# Patient Record
Sex: Male | Born: 1970 | Race: White | Hispanic: No | Marital: Married | Smoking: Never smoker
Health system: Southern US, Community
[De-identification: ages and names within clinical notes are randomized; demographics above are authoritative.]

## PROBLEM LIST (undated history)

## (undated) DIAGNOSIS — M2540 Effusion, unspecified joint: Secondary | ICD-10-CM

## (undated) DIAGNOSIS — E785 Hyperlipidemia, unspecified: Secondary | ICD-10-CM

## (undated) DIAGNOSIS — R202 Paresthesia of skin: Secondary | ICD-10-CM

## (undated) DIAGNOSIS — I10 Essential (primary) hypertension: Secondary | ICD-10-CM

## (undated) DIAGNOSIS — M2550 Pain in unspecified joint: Secondary | ICD-10-CM

## (undated) DIAGNOSIS — K219 Gastro-esophageal reflux disease without esophagitis: Secondary | ICD-10-CM

## (undated) DIAGNOSIS — Z8669 Personal history of other diseases of the nervous system and sense organs: Secondary | ICD-10-CM

## (undated) DIAGNOSIS — R200 Anesthesia of skin: Secondary | ICD-10-CM

## (undated) HISTORY — PX: RHINOPLASTY: SUR1284

---

## 2000-05-05 ENCOUNTER — Emergency Department (HOSPITAL_COMMUNITY): Admission: EM | Admit: 2000-05-05 | Discharge: 2000-05-05 | Payer: Self-pay | Admitting: Emergency Medicine

## 2014-01-18 ENCOUNTER — Encounter (HOSPITAL_COMMUNITY): Payer: Self-pay | Admitting: Family Medicine

## 2014-01-18 ENCOUNTER — Emergency Department (HOSPITAL_COMMUNITY)
Admission: EM | Admit: 2014-01-18 | Discharge: 2014-01-18 | Disposition: A | Discharge status: Home / Self Care | Payer: 59 | Source: Home / Self Care

## 2014-01-18 ENCOUNTER — Emergency Department (INDEPENDENT_AMBULATORY_CARE_PROVIDER_SITE_OTHER): Payer: 59

## 2014-01-18 DIAGNOSIS — I10 Essential (primary) hypertension: Secondary | ICD-10-CM

## 2014-01-18 DIAGNOSIS — Y9389 Activity, other specified: Secondary | ICD-10-CM

## 2014-01-18 DIAGNOSIS — S93401A Sprain of unspecified ligament of right ankle, initial encounter: Secondary | ICD-10-CM

## 2014-01-18 DIAGNOSIS — X500XXA Overexertion from strenuous movement or load, initial encounter: Secondary | ICD-10-CM

## 2014-01-18 DIAGNOSIS — S93409A Sprain of unspecified ligament of unspecified ankle, initial encounter: Secondary | ICD-10-CM

## 2014-01-18 HISTORY — DX: Essential (primary) hypertension: I10

## 2014-01-18 MED ORDER — METOPROLOL SUCCINATE ER 50 MG PO TB24: 50.0000 mg | ORAL_TABLET | Freq: Every day | ORAL | Status: AC

## 2014-01-18 NOTE — ED Notes (Signed)
Pt c/o right ankle inj x4 days Reports he twisted ankle going up the stairs.  Dr. Konrad DoloresMerrell is in the room w/the pt Alert w/no signs of acute distress.

## 2014-01-18 NOTE — ED Provider Notes (Signed)
CSN: [id number]     Arrival date & time 01/18/14  0827 History   None    Chief Complaint  Patient presents with  . Ankle Injury   (Consider location/radiation/quality/duration/timing/severity/associated sxs/prior Treatment) HPI  R ankle: 3 days ago going upstairs w/ child and ankle " gave out" . Unsure of exactly how his ankle was hurt but thinks it rolled in. Immediately painful and swollen. Hurts worse w/ ambulation and w/ plantar flexion. Ibuprofen 800 w/ some benefit.   HTN: take metoprolol but ran out in June. Denies CP, HA, SOB, Palpitations.    Past Medical History  Diagnosis Date  . Hypertension    Past Surgical History  Procedure Laterality Date  . Rhinoplasty     Family History  Problem Relation Age of Onset  . Hypertension Mother   . Hypertension Father    History  Substance Use Topics  . Smoking status: Never Smoker   . Smokeless tobacco: Not on file  . Alcohol Use: Yes    Review of Systems Per HPI with all other pertinent systems negative.   Allergies  Review of patient's allergies indicates no known allergies.  Home Medications   Prior to Admission medications   Medication Sig Start Date End Date Taking? Authorizing Provider  metoprolol succinate (TOPROL-XL) 50 MG 24 hr tablet Take 1 tablet (50 mg total) by mouth daily. Take with or immediately following a meal. 01/18/14   Ozella Rocks, MD   BP 153/109  Pulse 95  Temp(Src) 98.8 F (37.1 C) (Oral)  Resp 18  SpO2 98% Physical Exam  Constitutional: He appears well-developed and well-nourished.  HENT:  Head: Normocephalic and atraumatic.  Eyes: EOM are normal. Pupils are equal, round, and reactive to light.  Neck: Normal range of motion.  Cardiovascular: Normal rate, normal heart sounds and intact distal pulses.   No murmur heard. Pulmonary/Chest: Effort normal and breath sounds normal. No respiratory distress.  Abdominal: Soft. He exhibits no distension.  Musculoskeletal:  R ankle w/  decreased ROM secondary to pain. ttp along the inferior aspect of the lateral malleolus w/ surounding soft tissue swelling. Pain w/ plantar flexion and dorsiflexion agains resistance as well as internal and external rotation. Pulses and sensation intact. No ankle instability w/ antierior or posterior displacement.   Neurological: He is alert. He exhibits normal muscle tone.  Skin: Skin is warm. No rash noted.  Psychiatric: He has a normal mood and affect. His behavior is normal. Judgment and thought content normal.    ED Course  Procedures (including critical care time) Labs Review Labs Reviewed - No data to display  Imaging Review Dg Ankle Complete Right  01/18/2014   CLINICAL DATA:  Right ankle injury.  Ankle pain.  EXAM: RIGHT ANKLE - COMPLETE 3+ VIEW  COMPARISON:  None.  FINDINGS: Three views of the ankle demonstrate lateral soft tissue swelling. The ankle is located without a fracture. Alignment of the right ankle is normal.  IMPRESSION: Lateral soft tissue swelling without acute bone abnormality.   Electronically Signed   By: Richarda Overlie M.D.   On: 01/18/2014 09:33     MDM   1. Ankle sprain, right, initial encounter   2. Essential hypertension    Grade I ankle sprain: brace provided in clinic. Xray negative for fracture. NSAIDs, rest, ice. Early ambulation and no crutches necessary. Rehab exercises given  HTN: out of medications. Refilled metoprolol for 30 days  Precautions given and all questions answered  Shelly Flatten, MD Family Medicine 01/18/2014,  10:15 AM      Ozella Rocksavid J Eternity Dexter, MD 01/18/14 [id number]

## 2014-01-18 NOTE — Discharge Instructions (Signed)
You sprained your ankle. This will take several weeks to heal.  Wear your brace as needed and start your exercises to strengthen you ankle in the next 1-2 weeks.  Please srestart your blood pressure medications.    Acute Ankle Sprain with Phase I Rehab An acute ankle sprain is a partial or complete tear in one or more of the ligaments of the ankle due to traumatic injury. The severity of the injury depends on both the the number of ligaments sprained and the grade of sprain. There are 3 grades of sprains.   A grade 1 sprain is a mild sprain. There is a slight pull without obvious tearing. There is no loss of strength, and the muscle and ligament are the correct length.  A grade 2 sprain is a moderate sprain. There is tearing of fibers within the substance of the ligament where it connects two bones or two cartilages. The length of the ligament is increased, and there is usually decreased strength.  A grade 3 sprain is a complete rupture of the ligament and is uncommon. In addition to the grade of sprain, there are three types of ankle sprains.  Lateral ankle sprains: This is a sprain of one or more of the three ligaments on the outer side (lateral) of the ankle. These are the most common sprains. Medial ankle sprains: There is one large triangular ligament of the inner side (medial) of the ankle that is susceptible to injury. Medial ankle sprains are less common. Syndesmosis, "high ankle," sprains: The syndesmosis is the ligament that connects the two bones of the lower leg. Syndesmosis sprains usually only occur with very severe ankle sprains. SYMPTOMS  Pain, tenderness, and swelling in the ankle, starting at the side of injury that may progress to the whole ankle and foot with time.  "Pop" or tearing sensation at the time of injury.  Bruising that may spread to the heel.  Impaired ability to walk soon after injury. CAUSES   Acute ankle sprains are caused by trauma placed on the ankle  that temporarily forces or pries the anklebone (talus) out of its normal socket.  Stretching or tearing of the ligaments that normally hold the joint in place (usually due to a twisting injury). RISK INCREASES WITH:  Previous ankle sprain.  Sports in which the foot may land awkwardly (ie. basketball, volleyball, or soccer) or walking or running on uneven or rough surfaces.  Shoes with inadequate support to prevent sideways motion when stress occurs.  Poor strength and flexibility.  Poor balance skills.  Contact sports. PREVENTION   Warm up and stretch properly before activity.  Maintain physical fitness:  Ankle and leg flexibility, muscle strength, and endurance.  Cardiovascular fitness.  Balance training activities.  Use proper technique and have a coach correct improper technique.  Taping, protective strapping, bracing, or high-top tennis shoes may help prevent injury. Initially, tape is best; however, it loses most of its support function within 10 to 15 minutes.  Wear proper fitted protective shoes (High-top shoes with taping or bracing is more effective than either alone).  Provide the ankle with support during sports and practice activities for 12 months following injury. PROGNOSIS   If treated properly, ankle sprains can be expected to recover completely; however, the length of recovery depends on the degree of injury.  A grade 1 sprain usually heals enough in 5 to 7 days to allow modified activity and requires an average of 6 weeks to heal completely.  A grade 2  sprain requires 6 to 10 weeks to heal completely.  A grade 3 sprain requires 12 to 16 weeks to heal.  A syndesmosis sprain often takes more than 3 months to heal. RELATED COMPLICATIONS   Frequent recurrence of symptoms may result in a chronic problem. Appropriately addressing the problem the first time decreases the frequency of recurrence and optimizes healing time. Severity of the initial sprain does  not predict the likelihood of later instability.  Injury to other structures (bone, cartilage, or tendon).  A chronically unstable or arthritic ankle joint is a possiblity with repeated sprains. TREATMENT Treatment initially involves the use of ice, medication, and compression bandages to help reduce pain and inflammation. Ankle sprains are usually immobilized in a walking cast or boot to allow for healing. Crutches may be recommended to reduce pressure on the injury. After immobilization, strengthening and stretching exercises may be necessary to regain strength and a full range of motion. Surgery is rarely needed to treat ankle sprains. MEDICATION   Nonsteroidal anti-inflammatory medications, such as aspirin and ibuprofen (do not take for the first 3 days after injury or within 7 days before surgery), or other minor pain relievers, such as acetaminophen, are often recommended. Take these as directed by your caregiver. Contact your caregiver immediately if any bleeding, stomach upset, or signs of an allergic reaction occur from these medications.  Ointments applied to the skin may be helpful.  Pain relievers may be prescribed as necessary by your caregiver. Do not take prescription pain medication for longer than 4 to 7 days. Use only as directed and only as much as you need. HEAT AND COLD  Cold treatment (icing) is used to relieve pain and reduce inflammation for acute and chronic cases. Cold should be applied for 10 to 15 minutes every 2 to 3 hours for inflammation and pain and immediately after any activity that aggravates your symptoms. Use ice packs or an ice massage.  Heat treatment may be used before performing stretching and strengthening activities prescribed by your caregiver. Use a heat pack or a warm soak. SEEK IMMEDIATE MEDICAL CARE IF:   Pain, swelling, or bruising worsens despite treatment.  You experience pain, numbness, discoloration, or coldness in the foot or toes.  New,  unexplained symptoms develop (drugs used in treatment may produce side effects.) EXERCISES  PHASE I EXERCISES RANGE OF MOTION (ROM) AND STRETCHING EXERCISES - Ankle Sprain, Acute Phase I, Weeks 1 to 2 These exercises may help you when beginning to restore flexibility in your ankle. You will likely work on these exercises for the 1 to 2 weeks after your injury. Once your physician, physical therapist, or athletic trainer sees adequate progress, he or she will advance your exercises. While completing these exercises, remember:   Restoring tissue flexibility helps normal motion to return to the joints. This allows healthier, less painful movement and activity.  An effective stretch should be held for at least 30 seconds.  A stretch should never be painful. You should only feel a gentle lengthening or release in the stretched tissue. RANGE OF MOTION - Dorsi/Plantar Flexion  While sitting with your right / left knee straight, draw the top of your foot upwards by flexing your ankle. Then reverse the motion, pointing your toes downward.  Hold each position for __________ seconds.  After completing your first set of exercises, repeat this exercise with your knee bent. Repeat __________ times. Complete this exercise __________ times per day.  RANGE OF MOTION - Ankle Alphabet  Imagine your  right / left big toe is a pen.  Keeping your hip and knee still, write out the entire alphabet with your "pen." Make the letters as large as you can without increasing any discomfort. Repeat __________ times. Complete this exercise __________ times per day.  STRENGTHENING EXERCISES - Ankle Sprain, Acute -Phase I, Weeks 1 to 2 These exercises may help you when beginning to restore strength in your ankle. You will likely work on these exercises for 1 to 2 weeks after your injury. Once your physician, physical therapist, or athletic trainer sees adequate progress, he or she will advance your exercises. While  completing these exercises, remember:   Muscles can gain both the endurance and the strength needed for everyday activities through controlled exercises.  Complete these exercises as instructed by your physician, physical therapist, or athletic trainer. Progress the resistance and repetitions only as guided.  You may experience muscle soreness or fatigue, but the pain or discomfort you are trying to eliminate should never worsen during these exercises. If this pain does worsen, stop and make certain you are following the directions exactly. If the pain is still present after adjustments, discontinue the exercise until you can discuss the trouble with your clinician. STRENGTH - Dorsiflexors  Secure a rubber exercise band/tubing to a fixed object (ie. table, pole) and loop the other end around your right / left foot.  Sit on the floor facing the fixed object. The band/tubing should be slightly tense when your foot is relaxed.  Slowly draw your foot back toward you using your ankle and toes.  Hold this position for __________ seconds. Slowly release the tension in the band and return your foot to the starting position. Repeat __________ times. Complete this exercise __________ times per day.  STRENGTH - Plantar-flexors   Sit with your right / left leg extended. Holding onto both ends of a rubber exercise band/tubing, loop it around the ball of your foot. Keep a slight tension in the band.  Slowly push your toes away from you, pointing them downward.  Hold this position for __________ seconds. Return slowly, controlling the tension in the band/tubing. Repeat __________ times. Complete this exercise __________ times per day.  STRENGTH - Ankle Eversion  Secure one end of a rubber exercise band/tubing to a fixed object (table, pole). Loop the other end around your foot just before your toes.  Place your fists between your knees. This will focus your strengthening at your ankle.  Drawing the  band/tubing across your opposite foot, slowly, pull your little toe out and up. Make sure the band/tubing is positioned to resist the entire motion.  Hold this position for __________ seconds. Have your muscles resist the band/tubing as it slowly pulls your foot back to the starting position.  Repeat __________ times. Complete this exercise __________ times per day.  STRENGTH - Ankle Inversion  Secure one end of a rubber exercise band/tubing to a fixed object (table, pole). Loop the other end around your foot just before your toes.  Place your fists between your knees. This will focus your strengthening at your ankle.  Slowly, pull your big toe up and in, making sure the band/tubing is positioned to resist the entire motion.  Hold this position for __________ seconds.  Have your muscles resist the band/tubing as it slowly pulls your foot back to the starting position. Repeat __________ times. Complete this exercises __________ times per day.  STRENGTH - Towel Curls  Sit in a chair positioned on a non-carpeted surface.  Place your right / left foot on a towel, keeping your heel on the floor.  Pull the towel toward your heel by only curling your toes. Keep your heel on the floor.  If instructed by your physician, physical therapist, or athletic trainer, add weight to the end of the towel. Repeat __________ times. Complete this exercise __________ times per day. Document Released: 01/21/2005 Document Revised: 09/14/2011 Document Reviewed: 10/04/2008 [facility] Patient Information 2015 [employer] [place]. This information is not intended to replace advice given to you by your health care provider. Make sure you discuss any questions you have with your health care provider.

## 2014-02-22 ENCOUNTER — Other Ambulatory Visit (HOSPITAL_COMMUNITY): Payer: Self-pay | Admitting: Orthopedic Surgery

## 2014-02-22 DIAGNOSIS — M25511 Pain in right shoulder: Secondary | ICD-10-CM

## 2014-03-09 ENCOUNTER — Ambulatory Visit (HOSPITAL_COMMUNITY)
Admission: RE | Admit: 2014-03-09 | Discharge: 2014-03-09 | Disposition: A | Discharge status: Home / Self Care | Payer: 59 | Source: Ambulatory Visit | Attending: Orthopedic Surgery | Admitting: Orthopedic Surgery

## 2014-03-09 DIAGNOSIS — M25511 Pain in right shoulder: Secondary | ICD-10-CM

## 2014-03-26 ENCOUNTER — Encounter (INDEPENDENT_AMBULATORY_CARE_PROVIDER_SITE_OTHER): Payer: Self-pay

## 2014-03-26 ENCOUNTER — Ambulatory Visit (HOSPITAL_COMMUNITY)
Admission: RE | Admit: 2014-03-26 | Discharge: 2014-03-26 | Disposition: A | Discharge status: Home / Self Care | Payer: 59 | Source: Ambulatory Visit | Attending: Orthopedic Surgery | Admitting: Orthopedic Surgery

## 2014-03-26 DIAGNOSIS — R209 Unspecified disturbances of skin sensation: Secondary | ICD-10-CM | POA: Insufficient documentation

## 2014-03-26 DIAGNOSIS — R29898 Other symptoms and signs involving the musculoskeletal system: Secondary | ICD-10-CM | POA: Diagnosis not present

## 2014-03-26 DIAGNOSIS — M25519 Pain in unspecified shoulder: Secondary | ICD-10-CM | POA: Diagnosis present

## 2014-04-09 ENCOUNTER — Encounter (HOSPITAL_COMMUNITY): Payer: Self-pay | Admitting: Pharmacy Technician

## 2014-04-11 NOTE — Pre-Procedure Instructions (Signed)
[name]  04/11/2014   Your procedure is scheduled on:  Thurs, Oct 15 @ 12:05 PM  [facility]  at 10:00 AM.  Call this number if you have problems the morning of surgery: [phone]   Remember:   Do not eat food or drink liquids after midnight.   Take these medicines the morning of surgery with A SIP OF WATER: Metoprolol(Toprol)              Stop taking your BC's. No Goody's,Aspirin,Aleve,Ibuprofen,Fish Oil,or any Herbal Medications   Do not wear jewelry  Do not wear lotions, powders, or colognes. You may wear deodorant.  Men may shave face and neck.  Do not bring valuables to the hospital.  [facility]                  for any belongings or valuables.               Contacts, dentures or bridgework may not be worn into surgery.  Leave suitcase in the car. After surgery it may be brought to your room.  For patients admitted to the hospital, discharge time is determined by your                treatment team.               Patients discharged the day of surgery will not be allowed to drive  home.    [facility] - Preparing for Surgery  Before surgery, you can play an important role.  Because skin is not sterile, your skin needs to be as free of germs as possible.  You can reduce the number of germs on you skin by washing with CHG (chlorahexidine gluconate) soap before surgery.  CHG is an antiseptic cleaner which kills germs and bonds with the skin to continue killing germs even after washing.  Please DO NOT use if you have an allergy to CHG or antibacterial soaps.  If your skin becomes reddened/irritated stop using the CHG and inform your nurse when you arrive at Short Stay.  Do not shave (including legs and underarms) for at least 48 hours prior to the first CHG shower.  You may shave your face.  Please follow these instructions carefully:   1.  Shower with CHG Soap the night before surgery and the                                 morning of Surgery.  2.  If you choose to wash your hair, wash your hair first as usual with your       normal shampoo.  3.  After you shampoo, rinse your hair and body thoroughly to remove the                      Shampoo.  4.  Use CHG as you would any other liquid soap.  You can apply chg directly       to the skin and wash gently with scrungie or a clean washcloth.  5.  Apply the CHG Soap to your body ONLY FROM THE NECK DOWN.        Do not use on open wounds or open sores.  Avoid contact with your eyes,       ears, mouth and genitals (private parts).  Wash genitals (private parts)  with your normal soap.  6.  Wash thoroughly, paying special attention to the area where your surgery        will be performed.  7.  Thoroughly rinse your body with warm water from the neck down.  8.  DO NOT shower/wash with your normal soap after using and rinsing off       the CHG Soap.  9.  Pat yourself dry with a clean towel.            10.  Wear clean pajamas.            11.  Place clean sheets on your bed the night of your first shower and do not        sleep with pets.  Day of Surgery  Do not apply any lotions/deoderants the morning of surgery.  Please wear clean clothes to the hospital/surgery center.     Please read over the following fact sheets that you were given: Pain Booklet, Coughing and Deep Breathing and Surgical Site Infection Prevention

## 2014-04-12 ENCOUNTER — Encounter (HOSPITAL_COMMUNITY)
Admission: RE | Admit: 2014-04-12 | Discharge: 2014-04-12 | Disposition: A | Discharge status: Home / Self Care | Payer: 59 | Source: Ambulatory Visit | Attending: Orthopedic Surgery | Admitting: Orthopedic Surgery

## 2014-04-12 ENCOUNTER — Ambulatory Visit (HOSPITAL_COMMUNITY)
Admission: RE | Admit: 2014-04-12 | Discharge: 2014-04-12 | Disposition: A | Discharge status: Home / Self Care | Payer: 59 | Source: Ambulatory Visit | Attending: Anesthesiology | Admitting: Anesthesiology

## 2014-04-12 ENCOUNTER — Encounter (HOSPITAL_COMMUNITY): Payer: Self-pay

## 2014-04-12 VITALS — BP 138/95 | HR 104 | Temp 97.8°F | Resp 20 | Ht 70.0 in | Wt 288.1 lb

## 2014-04-12 DIAGNOSIS — I159 Secondary hypertension, unspecified: Secondary | ICD-10-CM

## 2014-04-12 DIAGNOSIS — Z01818 Encounter for other preprocedural examination: Secondary | ICD-10-CM | POA: Diagnosis not present

## 2014-04-12 DIAGNOSIS — I10 Essential (primary) hypertension: Secondary | ICD-10-CM

## 2014-04-12 HISTORY — DX: Personal history of other diseases of the nervous system and sense organs: Z86.69

## 2014-04-12 HISTORY — DX: Paresthesia of skin: R20.2

## 2014-04-12 HISTORY — DX: Anesthesia of skin: R20.0

## 2014-04-12 HISTORY — DX: Hyperlipidemia, unspecified: E78.5

## 2014-04-12 HISTORY — DX: Gastro-esophageal reflux disease without esophagitis: K21.9

## 2014-04-12 HISTORY — DX: Pain in unspecified joint: M25.50

## 2014-04-12 HISTORY — DX: Effusion, unspecified joint: M25.40

## 2014-04-12 LAB — BASIC METABOLIC PANEL
ANION GAP: 14 (ref 5–15)
BUN: 11 mg/dL (ref 6–23)
CALCIUM: 9.1 mg/dL (ref 8.4–10.5)
CO2: 22 mEq/L (ref 19–32)
CREATININE: 0.8 mg/dL (ref 0.50–1.35)
Chloride: 105 mEq/L (ref 96–112)
GFR calc Af Amer: >90 mL/min (ref >90)
Glucose, Bld: 101 mg/dL — ABNORMAL HIGH (ref 70–99)
Potassium: 4.1 mEq/L (ref 3.7–5.3)
SODIUM: 141 meq/L (ref 137–147)

## 2014-04-12 LAB — CBC
HCT: 46.3 % (ref 39.0–52.0)
Hemoglobin: 16.1 g/dL (ref 13.0–17.0)
MCH: 31.8 pg (ref 26.0–34.0)
MCHC: 34.8 g/dL (ref 30.0–36.0)
MCV: 91.3 fL (ref 78.0–100.0)
PLATELETS: 203 10*3/uL (ref 150–400)
RBC: 5.07 MIL/uL (ref 4.22–5.81)
RDW: 12.8 % (ref 11.5–15.5)
WBC: 5.9 10*3/uL (ref 4.0–10.5)

## 2014-04-12 NOTE — Progress Notes (Signed)
04/12/14 0831  OBSTRUCTIVE SLEEP APNEA  Have you ever been diagnosed with sleep apnea through a sleep study? No  Do you snore loudly (loud enough to be heard through closed doors)?  1  Do you often feel tired, fatigued, or sleepy during the daytime? 0  Has anyone observed you stop breathing during your sleep? 0  Do you have, or are you being treated for high blood pressure? 1  BMI more than 35 kg/m2? 1  Age over 45 years old? 0  Neck circumference greater than 40 cm/16 inches? 1  Gender: 1  Obstructive Sleep Apnea Score 5  Score 4 or greater  Results sent to PCP

## 2014-04-12 NOTE — Progress Notes (Addendum)
Pt doesn't have a cardiologist  [facility] [place] [phone]  Denies ever having an echo or heart cath   Denies CXR in past yr   Medical Md is Dr.Scott Holwerda with [facility]   EKG to be requested from Medical Md

## 2014-04-18 MED ORDER — LACTATED RINGERS IV SOLN: INTRAVENOUS | Status: DC

## 2014-04-18 MED ORDER — DEXTROSE 5 % IV SOLN
3.0000 g | INTRAVENOUS | Status: AC
  Administered 2014-04-19: 3 g via INTRAVENOUS
  Filled 2014-04-18: qty 3000

## 2014-04-19 ENCOUNTER — Ambulatory Visit (HOSPITAL_COMMUNITY): Payer: 59 | Admitting: Anesthesiology

## 2014-04-19 ENCOUNTER — Encounter (HOSPITAL_COMMUNITY): Payer: Self-pay | Admitting: Surgery

## 2014-04-19 ENCOUNTER — Encounter (HOSPITAL_COMMUNITY)
Admission: RE | Disposition: A | Discharge status: Home / Self Care | Payer: Self-pay | Source: Ambulatory Visit | Attending: Orthopedic Surgery

## 2014-04-19 ENCOUNTER — Encounter (HOSPITAL_COMMUNITY): Payer: 59 | Admitting: Anesthesiology

## 2014-04-19 ENCOUNTER — Ambulatory Visit (HOSPITAL_COMMUNITY)
Admission: RE | Admit: 2014-04-19 | Discharge: 2014-04-19 | Disposition: A | Discharge status: Home / Self Care | Payer: 59 | Source: Ambulatory Visit | Attending: Orthopedic Surgery | Admitting: Orthopedic Surgery

## 2014-04-19 DIAGNOSIS — E785 Hyperlipidemia, unspecified: Secondary | ICD-10-CM | POA: Diagnosis not present

## 2014-04-19 DIAGNOSIS — K219 Gastro-esophageal reflux disease without esophagitis: Secondary | ICD-10-CM | POA: Insufficient documentation

## 2014-04-19 DIAGNOSIS — M7541 Impingement syndrome of right shoulder: Secondary | ICD-10-CM | POA: Insufficient documentation

## 2014-04-19 DIAGNOSIS — I10 Essential (primary) hypertension: Secondary | ICD-10-CM | POA: Diagnosis not present

## 2014-04-19 LAB — HEPATIC FUNCTION PANEL
ALBUMIN: 4 g/dL (ref 3.5–5.2)
ALT: 54 U/L — AB (ref 0–53)
AST: 35 U/L (ref 0–37)
Alkaline Phosphatase: 98 U/L (ref 39–117)
Bilirubin, Direct: <0.2 mg/dL (ref 0.0–0.3)
TOTAL PROTEIN: 7.7 g/dL (ref 6.0–8.3)
Total Bilirubin: 0.5 mg/dL (ref 0.3–1.2)

## 2014-04-19 LAB — CBC WITH DIFFERENTIAL/PLATELET
BASOS PCT: 1 % (ref 0–1)
Basophils Absolute: 0 10*3/uL (ref 0.0–0.1)
EOS ABS: 0.1 10*3/uL (ref 0.0–0.7)
Eosinophils Relative: 3 % (ref 0–5)
HCT: 47.7 % (ref 39.0–52.0)
Hemoglobin: 16.5 g/dL (ref 13.0–17.0)
Lymphocytes Relative: 39 % (ref 12–46)
Lymphs Abs: 2.1 10*3/uL (ref 0.7–4.0)
MCH: 31.4 pg (ref 26.0–34.0)
MCHC: 34.6 g/dL (ref 30.0–36.0)
MCV: 90.9 fL (ref 78.0–100.0)
MONO ABS: 0.6 10*3/uL (ref 0.1–1.0)
MONOS PCT: 10 % (ref 3–12)
Neutro Abs: 2.6 10*3/uL (ref 1.7–7.7)
Neutrophils Relative %: 47 % (ref 43–77)
Platelets: 191 10*3/uL (ref 150–400)
RBC: 5.25 MIL/uL (ref 4.22–5.81)
RDW: 12.7 % (ref 11.5–15.5)
WBC: 5.4 10*3/uL (ref 4.0–10.5)

## 2014-04-19 LAB — PROTIME-INR
INR: 1.06 (ref 0.00–1.49)
Prothrombin Time: 13.9 seconds (ref 11.6–15.2)

## 2014-04-19 LAB — APTT: APTT: 28 s (ref 24–37)

## 2014-04-19 SURGERY — SHOULDER ARTHROSCOPY WITH SUBACROMIAL DECOMPRESSION AND DISTAL CLAVICLE EXCISION
Anesthesia: Regional | Site: Shoulder | Laterality: Right

## 2014-04-19 MED ORDER — DEXAMETHASONE SODIUM PHOSPHATE 4 MG/ML IJ SOLN
INTRAMUSCULAR | Status: DC | PRN
  Administered 2014-04-19: 4 mg via INTRAVENOUS

## 2014-04-19 MED ORDER — FENTANYL CITRATE 0.05 MG/ML IJ SOLN
100.0000 ug | Freq: Once | INTRAMUSCULAR | Status: AC
  Administered 2014-04-19: 100 ug via INTRAVENOUS

## 2014-04-19 MED ORDER — ACETAMINOPHEN 325 MG PO TABS
ORAL_TABLET | ORAL | Status: AC
  Filled 2014-04-19: qty 1

## 2014-04-19 MED ORDER — NEOSTIGMINE METHYLSULFATE 10 MG/10ML IV SOLN
INTRAVENOUS | Status: DC | PRN
  Administered 2014-04-19: 4 mg via INTRAVENOUS

## 2014-04-19 MED ORDER — HYDROMORPHONE HCL 1 MG/ML IJ SOLN
INTRAMUSCULAR | Status: DC
Start: 2014-04-19 — End: 2014-04-19
  Filled 2014-04-19: qty 1

## 2014-04-19 MED ORDER — OXYCODONE-ACETAMINOPHEN 5-325 MG PO TABS
1.0000 | ORAL_TABLET | Freq: Once | ORAL | Status: AC
  Administered 2014-04-19: 2 via ORAL

## 2014-04-19 MED ORDER — MIDAZOLAM HCL 2 MG/2ML IJ SOLN
INTRAMUSCULAR | Status: AC
  Administered 2014-04-19: 2 mg
  Filled 2014-04-19: qty 2

## 2014-04-19 MED ORDER — DEXAMETHASONE SODIUM PHOSPHATE 4 MG/ML IJ SOLN
INTRAMUSCULAR | Status: AC
  Filled 2014-04-19: qty 2

## 2014-04-19 MED ORDER — NAPROXEN 500 MG PO TABS: 500.0000 mg | ORAL_TABLET | Freq: Two times a day (BID) | ORAL | Status: AC

## 2014-04-19 MED ORDER — DIAZEPAM 5 MG PO TABS: 2.5000 mg | ORAL_TABLET | Freq: Four times a day (QID) | ORAL | Status: AC | PRN

## 2014-04-19 MED ORDER — PROPOFOL 10 MG/ML IV BOLUS
INTRAVENOUS | Status: AC
  Filled 2014-04-19: qty 20

## 2014-04-19 MED ORDER — LACTATED RINGERS IV SOLN
INTRAVENOUS | Status: DC
  Administered 2014-04-19: 11:00:00 via INTRAVENOUS

## 2014-04-19 MED ORDER — OXYCODONE-ACETAMINOPHEN 5-325 MG PO TABS
ORAL_TABLET | ORAL | Status: AC
  Administered 2014-04-19: 2 via ORAL
  Filled 2014-04-19: qty 2

## 2014-04-19 MED ORDER — ROCURONIUM BROMIDE 100 MG/10ML IV SOLN
INTRAVENOUS | Status: DC | PRN
  Administered 2014-04-19: 40 mg via INTRAVENOUS

## 2014-04-19 MED ORDER — OXYCODONE-ACETAMINOPHEN 5-325 MG PO TABS: 1.0000 | ORAL_TABLET | ORAL | Status: AC | PRN

## 2014-04-19 MED ORDER — MIDAZOLAM HCL 2 MG/2ML IJ SOLN: 2.0000 mg | Freq: Once | INTRAMUSCULAR | Status: DC

## 2014-04-19 MED ORDER — SODIUM CHLORIDE 0.9 % IR SOLN
Status: DC | PRN
  Administered 2014-04-19: 6000 mL

## 2014-04-19 MED ORDER — FENTANYL CITRATE 0.05 MG/ML IJ SOLN
INTRAMUSCULAR | Status: AC
  Administered 2014-04-19: 100 ug via INTRAVENOUS
  Filled 2014-04-19: qty 2

## 2014-04-19 MED ORDER — ONDANSETRON HCL 4 MG/2ML IJ SOLN
INTRAMUSCULAR | Status: AC
  Filled 2014-04-19: qty 2

## 2014-04-19 MED ORDER — GLYCOPYRROLATE 0.2 MG/ML IJ SOLN
INTRAMUSCULAR | Status: AC
  Filled 2014-04-19: qty 2

## 2014-04-19 MED ORDER — SUCCINYLCHOLINE CHLORIDE 20 MG/ML IJ SOLN
INTRAMUSCULAR | Status: DC | PRN
  Administered 2014-04-19: 80 mg via INTRAVENOUS

## 2014-04-19 MED ORDER — HYDROMORPHONE HCL 1 MG/ML IJ SOLN
0.5000 mg | INTRAMUSCULAR | Status: DC | PRN
  Administered 2014-04-19 (×2): 0.5 mg via INTRAVENOUS

## 2014-04-19 MED ORDER — ROPIVACAINE HCL 5 MG/ML IJ SOLN
INTRAMUSCULAR | Status: DC | PRN
  Administered 2014-04-19: 20 mL via PERINEURAL

## 2014-04-19 MED ORDER — GLYCOPYRROLATE 0.2 MG/ML IJ SOLN
INTRAMUSCULAR | Status: DC | PRN
  Administered 2014-04-19: 0.6 mg via INTRAVENOUS

## 2014-04-19 MED ORDER — LIDOCAINE HCL (CARDIAC) 20 MG/ML IV SOLN
INTRAVENOUS | Status: DC | PRN
  Administered 2014-04-19: 80 mg via INTRAVENOUS

## 2014-04-19 MED ORDER — PROPOFOL 10 MG/ML IV BOLUS
INTRAVENOUS | Status: DC | PRN
  Administered 2014-04-19: 200 mg via INTRAVENOUS

## 2014-04-19 MED ORDER — FENTANYL CITRATE 0.05 MG/ML IJ SOLN
INTRAMUSCULAR | Status: DC | PRN
  Administered 2014-04-19 (×3): 50 ug via INTRAVENOUS

## 2014-04-19 MED ORDER — CHLORHEXIDINE GLUCONATE 4 % EX LIQD
60.0000 mL | Freq: Once | CUTANEOUS | Status: DC
  Filled 2014-04-19: qty 60

## 2014-04-19 MED ORDER — LACTATED RINGERS IV SOLN
INTRAVENOUS | Status: DC | PRN
  Administered 2014-04-19 (×2): via INTRAVENOUS

## 2014-04-19 MED ORDER — ARTIFICIAL TEARS OP OINT
TOPICAL_OINTMENT | OPHTHALMIC | Status: AC
  Filled 2014-04-19: qty 3.5

## 2014-04-19 MED ORDER — FENTANYL CITRATE 0.05 MG/ML IJ SOLN
INTRAMUSCULAR | Status: AC
  Filled 2014-04-19: qty 5

## 2014-04-19 MED ORDER — ROCURONIUM BROMIDE 50 MG/5ML IV SOLN
INTRAVENOUS | Status: AC
  Filled 2014-04-19: qty 1

## 2014-04-19 MED ORDER — DEXAMETHASONE SODIUM PHOSPHATE 4 MG/ML IJ SOLN
INTRAMUSCULAR | Status: AC
  Filled 2014-04-19: qty 3

## 2014-04-19 MED ORDER — ONDANSETRON HCL 4 MG/2ML IJ SOLN
INTRAMUSCULAR | Status: DC | PRN
  Administered 2014-04-19: 4 mg via INTRAVENOUS

## 2014-04-19 SURGICAL SUPPLY — 65 items
BLADE CUTTER GATOR 3.5 (BLADE) ×3 IMPLANT
BLADE GREAT WHITE 4.2 (BLADE) ×2 IMPLANT
BLADE GREAT WHITE 4.2MM (BLADE) ×1
BLADE SURG 11 STRL SS (BLADE) ×3 IMPLANT
BOOTCOVER CLEANROOM LRG (PROTECTIVE WEAR) ×6 IMPLANT
BUR 3.5 LG SPHERICAL (BURR) IMPLANT
BUR OVAL 4.0 (BURR) ×3 IMPLANT
BURR 3.5 LG SPHERICAL (BURR)
BURR 3.5MM LG SPHERICAL (BURR)
CANISTER SUCT LVC 12 LTR MEDI- (MISCELLANEOUS) ×3 IMPLANT
CANNULA ACUFLEX KIT 5X76 (CANNULA) ×3 IMPLANT
CANNULA DRILOCK 5.0MMX75MM (CANNULA)
CANNULA DRILOCK 5.0X75 (CANNULA) IMPLANT
CLOSURE WOUND 1/2 X4 (GAUZE/BANDAGES/DRESSINGS) ×1
CONNECTOR 5 IN 1 STRAIGHT STRL (MISCELLANEOUS) ×3 IMPLANT
DRAPE INCISE 23X17 IOBAN STRL (DRAPES) ×2
DRAPE INCISE 23X17 STRL (DRAPES) ×1 IMPLANT
DRAPE INCISE IOBAN 23X17 STRL (DRAPES) ×1 IMPLANT
DRAPE INCISE IOBAN 66X45 STRL (DRAPES) ×3 IMPLANT
DRAPE STERI 35X30 U-POUCH (DRAPES) ×3 IMPLANT
DRAPE SURG 17X11 SM STRL (DRAPES) ×3 IMPLANT
DRAPE U-SHAPE 47X51 STRL (DRAPES) ×3 IMPLANT
DRSG PAD ABDOMINAL 8X10 ST (GAUZE/BANDAGES/DRESSINGS) ×4 IMPLANT
DURAPREP 26ML APPLICATOR (WOUND CARE) ×6 IMPLANT
ELECT REM PT RETURN 9FT ADLT (ELECTROSURGICAL) ×3
ELECTRODE REM PT RTRN 9FT ADLT (ELECTROSURGICAL) ×1 IMPLANT
FLUID NSS /IRRIG 3000 ML XXX (IV SOLUTION) ×4 IMPLANT
GAUZE SPONGE 4X4 12PLY STRL (GAUZE/BANDAGES/DRESSINGS) ×3 IMPLANT
GLOVE BIO SURGEON STRL SZ7.5 (GLOVE) ×3 IMPLANT
GLOVE BIO SURGEON STRL SZ8 (GLOVE) ×3 IMPLANT
GLOVE EUDERMIC 7 POWDERFREE (GLOVE) ×3 IMPLANT
GLOVE SS BIOGEL STRL SZ 7.5 (GLOVE) ×1 IMPLANT
GLOVE SUPERSENSE BIOGEL SZ 7.5 (GLOVE) ×2
GOWN STRL REUS W/ TWL LRG LVL3 (GOWN DISPOSABLE) ×1 IMPLANT
GOWN STRL REUS W/ TWL XL LVL3 (GOWN DISPOSABLE) ×4 IMPLANT
GOWN STRL REUS W/TWL LRG LVL3 (GOWN DISPOSABLE) ×3
GOWN STRL REUS W/TWL XL LVL3 (GOWN DISPOSABLE) ×12
KIT BASIN OR (CUSTOM PROCEDURE TRAY) ×3 IMPLANT
KIT ROOM TURNOVER OR (KITS) ×3 IMPLANT
KIT SHOULDER TRACTION (DRAPES) ×3 IMPLANT
MANIFOLD NEPTUNE II (INSTRUMENTS) ×3 IMPLANT
NDL SPNL 18GX3.5 QUINCKE PK (NEEDLE) ×1 IMPLANT
NDL SUT 6 .5 CRC .975X.05 MAYO (NEEDLE) IMPLANT
NEEDLE MAYO TAPER (NEEDLE)
NEEDLE SPNL 18GX3.5 QUINCKE PK (NEEDLE) ×3 IMPLANT
NS IRRIG 1000ML POUR BTL (IV SOLUTION) ×1 IMPLANT
PACK SHOULDER (CUSTOM PROCEDURE TRAY) ×3 IMPLANT
PAD ARMBOARD 7.5X6 YLW CONV (MISCELLANEOUS) ×6 IMPLANT
SET ARTHROSCOPY TUBING (MISCELLANEOUS) ×3
SET ARTHROSCOPY TUBING LN (MISCELLANEOUS) ×1 IMPLANT
SLING ARM IMMOBILIZER LRG (SOFTGOODS) ×2 IMPLANT
SLING ARM LRG ADULT FOAM STRAP (SOFTGOODS) IMPLANT
SLING ARM MED ADULT FOAM STRAP (SOFTGOODS) ×3 IMPLANT
SPONGE GAUZE 4X4 12PLY STER LF (GAUZE/BANDAGES/DRESSINGS) ×2 IMPLANT
SPONGE LAP 4X18 X RAY DECT (DISPOSABLE) ×3 IMPLANT
STRIP CLOSURE SKIN 1/2X4 (GAUZE/BANDAGES/DRESSINGS) ×2 IMPLANT
SUT MNCRL AB 3-0 PS2 18 (SUTURE) ×3 IMPLANT
SUT PDS AB 0 CT 36 (SUTURE) IMPLANT
SUT RETRIEVER GRASP 30 DEG (SUTURE) IMPLANT
SYR 20CC LL (SYRINGE) ×3 IMPLANT
TAPE PAPER 3X10 WHT MICROPORE (GAUZE/BANDAGES/DRESSINGS) ×3 IMPLANT
TOWEL OR 17X24 6PK STRL BLUE (TOWEL DISPOSABLE) ×3 IMPLANT
TOWEL OR 17X26 10 PK STRL BLUE (TOWEL DISPOSABLE) ×3 IMPLANT
WAND SUCTION MAX 4MM 90S (SURGICAL WAND) ×3 IMPLANT
WATER STERILE IRR 1000ML POUR (IV SOLUTION) ×3 IMPLANT

## 2014-04-19 NOTE — H&P (Signed)
[name]    Chief Complaint: RIGHT SHOULDER IMPINGEMENT  HPI: The patient is a 43 y.o. 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  Past Medical History  Diagnosis Date  . Hypertension     takes Metoprolol daily  . Hyperlipidemia     was on meds but has been off for over 5 yrs  . History of migraine     last one several months ago  . Numbness and tingling     and weakness in right arm  . Joint pain   . Joint swelling   . GERD (gastroesophageal reflux disease)     takes OTC meds as needed    Past Surgical History  Procedure Laterality Date  . Rhinoplasty  around 1998    Family History  Problem Relation Age of Onset  . Hypertension Mother   . Hypertension Father     Social History:  reports that he has never smoked. He does not have any smokeless tobacco history on file. He reports that he drinks alcohol. He reports that he does not use illicit drugs.  Allergies: No Known Allergies  Medications Prior to Admission  Medication Sig Dispense Refill  . Aspirin-Salicylamide-Caffeine (BC HEADACHE POWDER PO) Take 1 packet by mouth daily as needed (for pain).       . metoprolol succinate (TOPROL-XL) 50 MG 24 hr tablet Take 1 tablet (50 mg total) by mouth daily. Take with or immediately following a meal.  30 tablet  0     Physical Exam: right shoulder with painful and restricted motion as noted at recent office visits  Vitals  Temp:  [97.6 F (36.4 C)] 97.6 F (36.4 C) (10/15 0952) Pulse Rate:  [73] 73 (10/15 0952) Resp:  [20] 20 (10/15 0952) BP: (154)/(88) 154/88 mmHg (10/15 0952) SpO2:  [99 %] 99 % (10/15 0952) Weight:  [130.636 kg (288 lb)] 130.636 kg (288 lb) (10/15 0952)  Assessment/Plan  Impression: RIGHT SHOULDER IMPINGEMENT   Plan of Action: Procedure(s): RIGHT SHOULDER ARTHROSCOPY SUBACROMIAL DECOMPRESSION AND DISTAL CLAVICLE RESECTION   [name] M 04/19/2014, 11:28 AM

## 2014-04-19 NOTE — Transfer of Care (Signed)
Immediate Anesthesia Transfer of Care Note  Patient: [name]  Procedure(s) Performed: Procedure(s): RIGHT SHOULDER ARTHROSCOPY SUBACROMIAL DECOMPRESSION AND DISTAL CLAVICLE RESECTION  (Right)  Patient Location: PACU  Anesthesia Type:GA combined with regional for post-op pain  Level of Consciousness: awake, alert  and oriented  Airway & Oxygen Therapy: Patient Spontanous Breathing and Patient connected to face mask oxygen  Post-op Assessment: Report given to PACU RN  Post vital signs: Reviewed and stable  Complications: No apparent anesthesia complications

## 2014-04-19 NOTE — Op Note (Signed)
NAMLeighton Montoya:  [name], [name]            ACCOUNT NO.:  [id number]  MEDICAL RECORD NO.:  [id number]  LOCATION:  [facility]                         FACILITY:  [facility]  PHYSICIAN:  Vania ReaKevin M. Elie Gragert, M.D.  DATE OF BIRTH:  07-05-1971  DATE OF PROCEDURE:  04/19/2014 DATE OF DISCHARGE:  04/19/2014                              OPERATIVE REPORT   PREOPERATIVE DIAGNOSES: 1. Chronic right shoulder impingement syndrome. 2. Right shoulder symptomatic AC joint arthropathy.  POSTOPERATIVE DIAGNOSES: 1. Chronic right shoulder impingement syndrome. 2. Right shoulder symptomatic AC joint arthropathy. 3. Complex and extensive posterior labral tear. 4. 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  PROCEDURE PERFORMED: 1. Right shoulder examination under anesthesia. 2. Right shoulder glenohumeral joint diagnostic arthroscopy. 3. Debridement of complex and extensive posterior labral tear. 4. Chondroplasty of the glenoid. 5. Arthroscopic subacromial decompression and bursectomy. 6. Arthroscopic distal clavicle resection.  SURGEON:  Vania ReaKevin M. Irianna Gilday, M.D.  Threasa HeadsASSISTANFrench Ana:  Tracy A. Shuford, PA-C.  ANESTHESIA:  General endotracheal as well as an interscalene block.  ESTIMATED BLOOD LOSS:  Minimal.  DRAINS:  None.  HISTORY:  Mr. [name] is a 43 year old gentleman, who has had chronic and progressive increasing right shoulder pain with examination showing a painful arc of motion with limitations and weakness with manual muscle testing.  Plain radiographs showed no acute fractures or dislocations, but do show evidence [facility].  MRI scan does not show any obvious full-thickness rotator cuff tear.  Due to his ongoing pain and function limitations, he is brought to the operative __________ planned right shoulder arthroscopy as described below.  Preoperatively,  counseled Mr. [name] on treatment options as well as risks versus benefits thereof.  Possible surgical complications were all reviewed with potential for bleeding, infection, neurovascular injury, persistent pain, loss of motion, anesthetic complication, and possible need for additional surgery.  He understands and accepts and agrees to the planned procedure.  PROCEDURE IN DETAIL:  After undergoing routine preop evaluation, the patient received prophylactic antibiotics.  An interscalene block was established in the holding area by the Anesthesia Department.  Placed upon the operating room table, underwent smooth induction of a general endotracheal anesthesia.  Turned to the left lateral decubitus position on a beanbag and appropriate padded protected.  Right shoulder examination under anesthesia revealed full motion.  No instability patterns noted.  Right arm was then suspended at 70 degrees of abduction with 15 pounds of traction and the right shoulder girdle region was sterilely prepped and draped in standard fashion.  Time-out was called. The posterior portal, __________ established under direct visualization. Immediately evident was a large complex and extensive tear of the labrum predominantly posteriorly, but also superior extension.  This was debrided with shaver and once this was completed, we could visualize the entirety of the glenoid and there was an area of significant cartilage damage and an area denuded subchondral bone on the posterior margin of the glenoid an area that I would estimate account for perhaps 15% to 20% of the surface of the glenoid with a number of large  flaps of cartilage at the margin and this area was debrided with a shaver to stable and smooth margin of cartilage and again debrided all of the torn adjacent labral tissues.  There was also an area of the carpal chondral thinning on the humeral head and the changes were all suggestive of that seen with  frequent heavy weightlifting particularly bench press with the humeral head is subluxed in the poster margin of the glenoid.  The biceps anchor was stable.  There is a very small anterior labral tear which we debrided.  The biceps tendon showed normal caliber.  No distal instability, rotator cuff was carefully inspected to be intact throughout.  No instability patterns were noted.  Although, certainly the wear pattern suggested posterior erosion of the humeral head over the margin of the glenoid related to again most likely some type of heavy weightbearing suggested bench press.  At this point, __________ ends were then removed.  The arm dropped down to 30 degrees of abduction with the arthroscope introduced in the subacromial space through the posterior portal direct lateral portal established subacromial space, abundant bursal tissue and multiple adhesions were encountered.  These were all divided and excised with a combination of shaver and Stryker wand.  The wand was then used to remove the periosteum from the undersurface of the anterior half of the acromion.  The subacromial depression was performed with a burr creating a type 1 morphology, __________ was established directly into the distal clavicle and the distal clavicle resection performed with a burr.  Care was taken to cover visualization times cuff of the distal clavicle to ensure adequate removal of bone.  We then completed subacromial/subdeltoid bursectomy. The bursal surface rotator cuff was carefully inspected and found to be intact.  Final hemostasis was obtained.  Fluid and instruments removed. The portals closed with Monocryl, Steri-Strips, and a dry dressing taped at the right shoulder right arm/sling the patient was awakened, extubated, taken to recovery room in stable condition.  Ralene Batheracy Shuford, PA-C, was used as an Geophysicist/field seismologistassistant throughout the case, essential for help with positioning the patient due to his  morbid obesity, positioning the extremity, management of the arthroscope equipment, tissue manipulation, wound closure, and intraoperative decision making.     Vania ReaKevin M. Arun Herrod, M.D.     KMS/MEDQ  D:  04/19/2014  T:  04/19/2014  Job:  [id number]

## 2014-04-19 NOTE — Anesthesia Postprocedure Evaluation (Signed)
  Anesthesia Post-op Note  Patient: [name]  Procedure(s) Performed: Procedure(s): RIGHT SHOULDER ARTHROSCOPY SUBACROMIAL DECOMPRESSION AND DISTAL CLAVICLE RESECTION  (Right)  Patient Location: PACU  Anesthesia Type:General and Regional  Level of Consciousness: awake and alert   Airway and Oxygen Therapy: Patient Spontanous Breathing  Post-op Pain: mild  Post-op Assessment: Post-op Vital signs reviewed, Patient's Cardiovascular Status Stable, Respiratory Function Stable, Patent Airway, No signs of Nausea or vomiting and Pain level controlled  Post-op Vital Signs: Reviewed and stable  Last Vitals:  Filed Vitals:   04/19/14 1415  BP: 124/69  Pulse: 67  Temp:   Resp: 14    Complications: No apparent anesthesia complications

## 2014-04-19 NOTE — Anesthesia Preprocedure Evaluation (Signed)
Anesthesia Evaluation  Patient identified by MRN, date of birth, ID band Patient awake    Reviewed: Allergy & Precautions, H&P , NPO status , Patient's Chart, lab work & pertinent test results, reviewed documented beta blocker date and time   Airway Mallampati: III TM Distance: >3 FB Neck ROM: Full  Mouth opening: Limited Mouth Opening  Dental  (+) Teeth Intact   Pulmonary    Pulmonary exam normal       Cardiovascular hypertension, Pt. on medications     Neuro/Psych    GI/Hepatic GERD-  ,  Endo/Other    Renal/GU      Musculoskeletal   Abdominal Normal abdominal exam  (+)   Peds  Hematology   Anesthesia Other Findings   Reproductive/Obstetrics                           Anesthesia Physical Anesthesia Plan  ASA: II  Anesthesia Plan: General and Regional   Post-op Pain Management: MAC Combined w/ Regional for Post-op pain   Induction: Intravenous  Airway Management Planned: Oral ETT  Additional Equipment:   Intra-op Plan:   Post-operative Plan: Extubation in OR  Informed Consent: I have reviewed the patients History and Physical, chart, labs and discussed the procedure including the risks, benefits and alternatives for the proposed anesthesia with the patient or authorized representative who has indicated his/her understanding and acceptance.   Dental advisory given  Plan Discussed with: CRNA, Anesthesiologist and Surgeon  Anesthesia Plan Comments:         Anesthesia Quick Evaluation

## 2014-04-19 NOTE — Op Note (Signed)
04/19/2014  1:10 PM  PATIENT:   [name]  43 y.o. male  PRE-OPERATIVE DIAGNOSIS:  RIGHT SHOULDER IMPINGEMENT with AC joint OA  POST-OPERATIVE DIAGNOSIS:  Same with early GH OA and complex posterior labral tear  PROCEDURE:  RSA, labral debridement, chondroplasty, SAD, DCR  SURGEON:  Sherryn Pollino, Vania ReaKevin M. M.D.  ASSISTANTS: Suford pac   ANESTHESIA:   GET + ISB  EBL: min  SPECIMEN:  none  Drains: none   PATIENT DISPOSITION:  PACU - hemodynamically stable.    PLAN OF CARE: Discharge to home after PACU  Dictation# [id number]

## 2014-04-19 NOTE — Anesthesia Procedure Notes (Signed)
Anesthesia Regional Block:  Interscalene brachial plexus block  Pre-Anesthetic Checklist: ,, timeout performed, Correct Patient, Correct Site, Correct Laterality, Correct Procedure, Correct Position, site marked, Risks and benefits discussed,  Surgical consent,  Pre-op evaluation,  At surgeon's request and post-op pain management  Laterality: Upper and Right  Prep: chloraprep       Needles:  Injection technique: Single-shot  Needle Type: Echogenic Stimulator Needle          Additional Needles:  Procedures: ultrasound guided (picture in chart) and nerve stimulator Interscalene brachial plexus block  Nerve Stimulator or Paresthesia:  Response: deltoid, 0.5 mA,   Additional Responses:   Narrative:  Start time: 04/19/2014 11:43 AM End time: 04/19/2014 11:53 AM Injection made incrementally with aspirations every 5 mL.  Performed by: Personally  Anesthesiologist: Shalae Belmonte  Additional Notes: H+P and labs reviewed, risks and benefits discussed with patient, procedure tolerated well without complications

## 2014-04-19 NOTE — Discharge Instructions (Signed)
° °Kevin M. Supple, M.D., F.A.A.O.S. °Orthopaedic Surgery °Specializing in Arthroscopic and Reconstructive °Surgery of the Shoulder and Knee °[phone] °[address] - [place], [place] [address] - Fax [phone] ° ° °POST-OP SHOULDER ARTHROSCOPY INSTRUCTIONS ° °1. Call the office at [phone] to schedule your first post-op appointment 7-10 days from the date of your surgery. ° °2. Leave the steri-strips in place over your incisions when performing dressing changes and showering. You may remove your dressings and begin showering 72 hours from surgery. You can expect drainage that is clear to bloody in nature that occasionally will soak through your dressings. If this occurs go ahead and perform a dressing change. The drainage should lessen daily and when there is no drainage from your incisions feel free to go without a dressing. ° °3. Wear your sling for comfort. You may come out of your sling for ad lib activity and even decide not to use the sling at all. If you find you are more comfortable in your sling, make sure you come out of your sling at least 3-4 times a day to do the exercises that are included below. ° °4. Range of motion to your elbow, wrist, and hand are encouraged 3-5 times daily. Exercise to your hand and fingers helps to reduce swelling you may experience. ° °5. Utilize ice to the shoulder 3-4 times minimum a day and additionally if you are experiencing pain. ° °6. You may drive when safely off narcotics and muscle relaxants. ° °7. If you had a block pre-operatively to provide post-op pain relief you may want to go ahead and begin utilizing your pain meds as your arm begins to wake up. Blocks can sometimes last up to 16-18 hours. If you are still pain-free prior to going to bed you may want to strongly consider taking a pain medication to avoid being awakened in the night with the onset of pain. A muscle relaxant is also provided for you should you experience muscle spasms. It  is recommended that if you are experiencing pain that your pain medication alone is not controlling, add the muscle relaxant along with the pain medication which can give additional pain relief. The first one to two days is generally the most severe of your pain and then should gradually decrease. As your pain lessens it is recommended that you decrease your use of the pain medications to an "as needed basis" only and to always comply with the recommended dosages of the pain medications. ° °8. Pain medications can produce constipation along with their use. If you experience this, the use of an over the counter stool softener or laxative daily is recommended.  ° °9. For additional questions or concerns, please do not hesitate to call the office. If after hours there is an answering service to forward your concerns to the physician on call. ° ° °POST-OP EXERCISES ° °The pendulum exercises should be performed while bending at the waist as far over as possible thereby letting gravity do the work for you. ° °Range of Motion Exercises: Pendulum (circular) ° °Repeat 20 times. Do 3 sessions per day. ° ° ° ° °Range of Motion Exercises: Pendulum (side-to-side) ° °Repeat 20 times. Do 3 sessions per day. ° ° ° °Range of Motion Exercises (self-stretching activities): ° °Slide arm up wall with palm toward you, moving closer to the wall. Hold for 5 seconds. ° °Repeat 10 times. Do 3 sessions per day. ° ° ° ° ° °What to eat: ° °For your   first meals, you should eat lightly; only small meals initially.  If you do not have nausea, you may eat larger meals.  Avoid spicy, greasy and heavy food.   ° °General Anesthesia, Adult, Care After  °Refer to this sheet in the next few weeks. These instructions provide you with information on caring for yourself after your procedure. Your health care provider may also give you more specific instructions. Your treatment has been planned according to current medical practices, but problems sometimes  occur. Call your health care provider if you have any problems or questions after your procedure.  °WHAT TO EXPECT AFTER THE PROCEDURE  °After the procedure, it is typical to experience:  °Sleepiness.  °Nausea and vomiting. °HOME CARE INSTRUCTIONS  °For the first 24 hours after general anesthesia:  °Have a responsible person with you.  °Do not drive a car. If you are alone, do not take public transportation.  °Do not drink alcohol.  °Do not take medicine that has not been prescribed by your health care provider.  °Do not sign important papers or make important decisions.  °You may resume a normal diet and activities as directed by your health care provider.  °Change bandages (dressings) as directed.  °If you have questions or problems that seem related to general anesthesia, call the hospital and ask for the anesthetist or anesthesiologist on call. °SEEK MEDICAL CARE IF:  °You have nausea and vomiting that continue the day after anesthesia.  °You develop a rash. °SEEK IMMEDIATE MEDICAL CARE IF:  °You have difficulty breathing.  °You have chest pain.  °You have any allergic problems. °Document Released: 09/28/2000 Document Revised: 02/22/2013 Document Reviewed: 01/05/2013  °[employer]® Patient Information ©2014 [employer].  ° ° °

## 2016-03-26 IMAGING — MR MR SHOULDER*R* W/O CM
4 of 5 series · 19 of 40 positions shown · non-contrast
Comparison: None.

CLINICAL DATA: Right shoulder pain with numbness and weakness.

EXAM:
MRI OF THE RIGHT SHOULDER WITHOUT CONTRAST
TECHNIQUE: Multiplanar, multisequence MR imaging of the shoulder was performed.
No intravenous contrast was administered.

[Series 6: PD · sagittal · 3.0mm · 0.27mm/px · 8 of 21 slices shown]
[im 1/21]
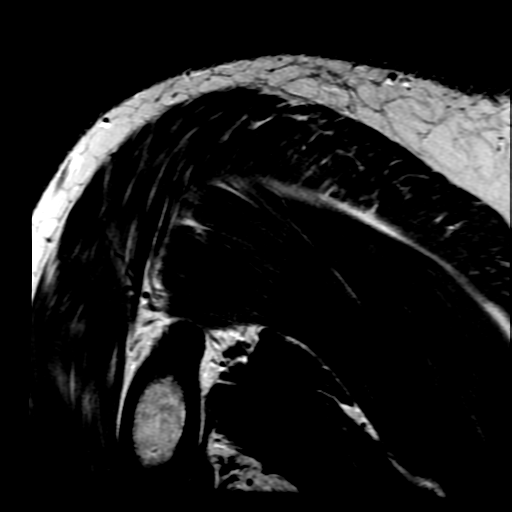
[im 3/21]
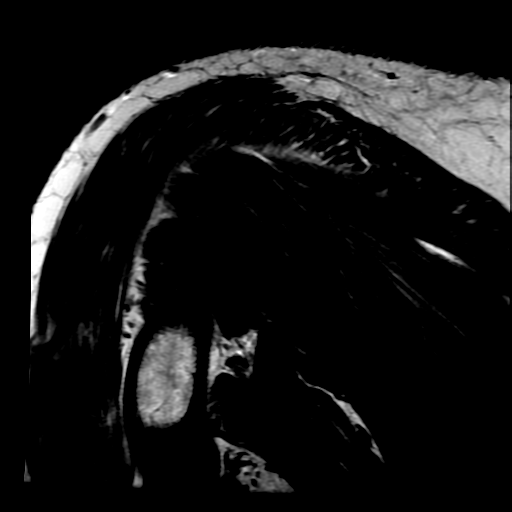
[im 6/21]
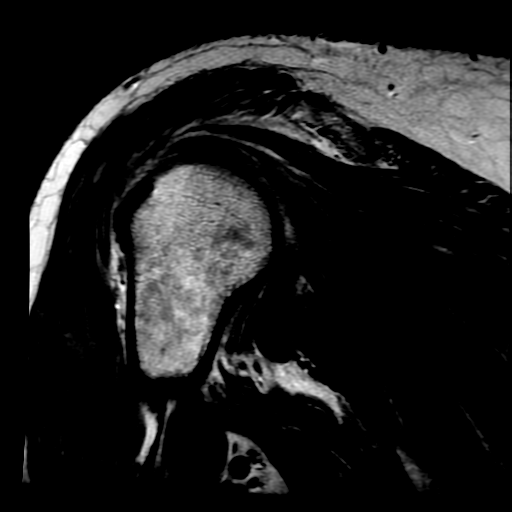
[im 9/21]
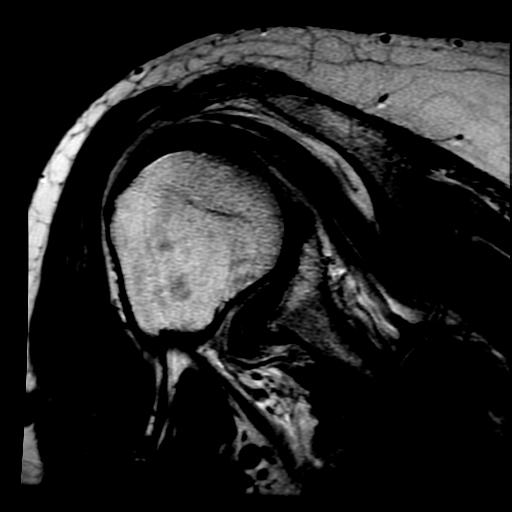
[im 12/21]
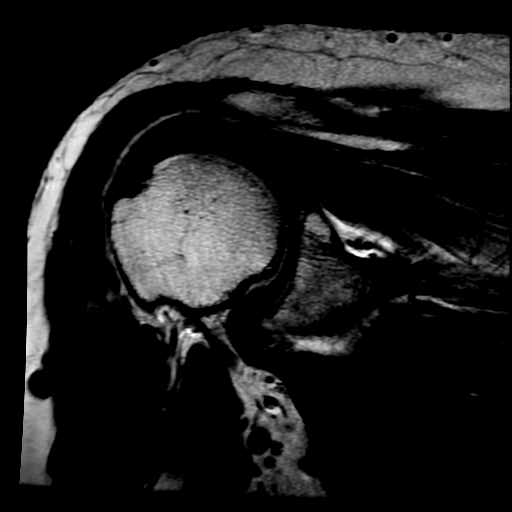
[im 15/21]
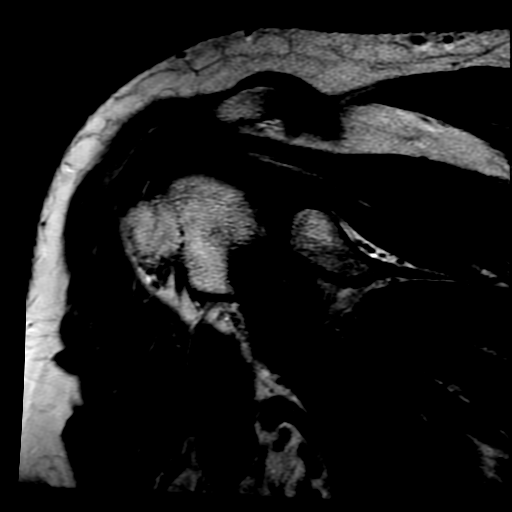
[im 18/21]
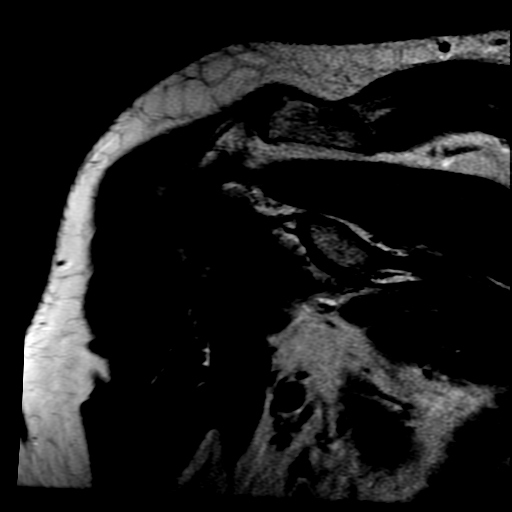
[im 21/21]
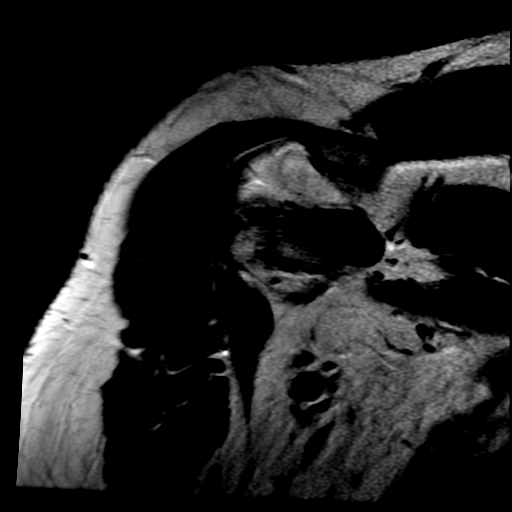

[Series 8: T2 fat-sat · oblique · 3.0mm · 0.27mm/px · 5 of 23 slices shown (1 of 3)]
[im 1/23]
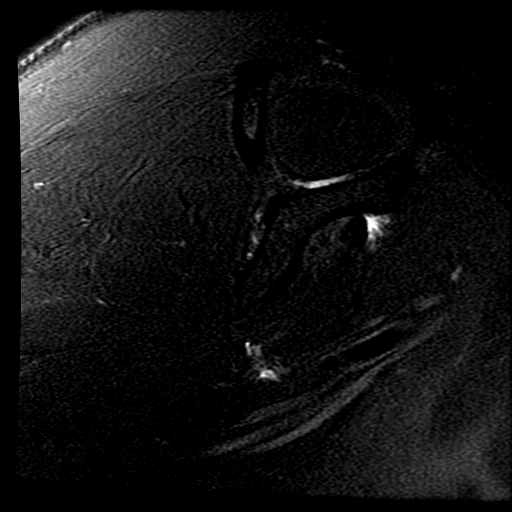
[im 4/23]
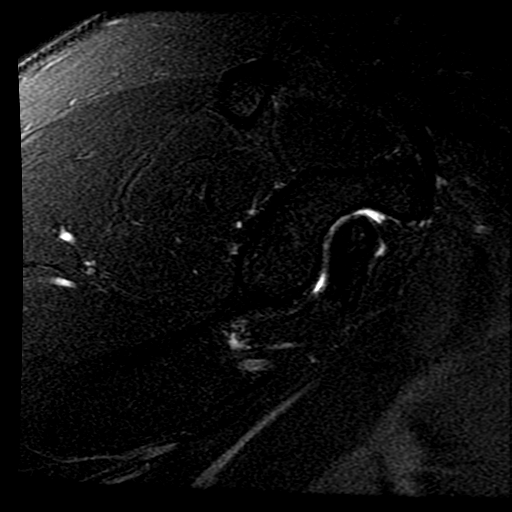
[im 7/23]
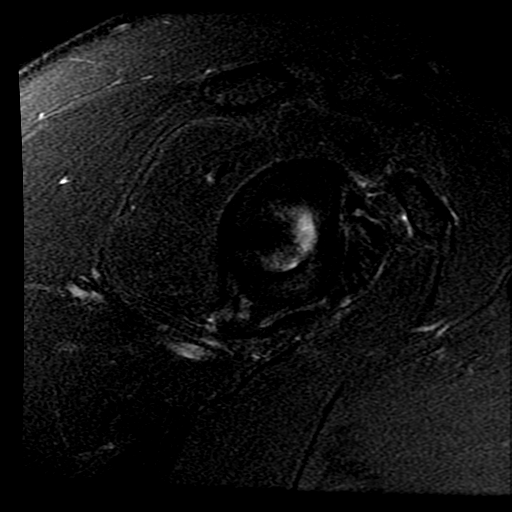
[im 13/23]
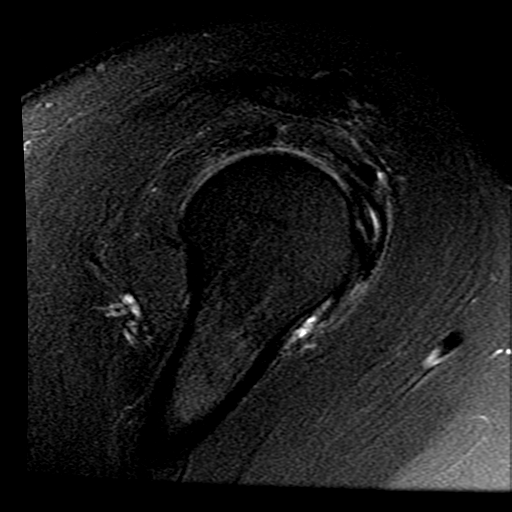
[im 19/23]
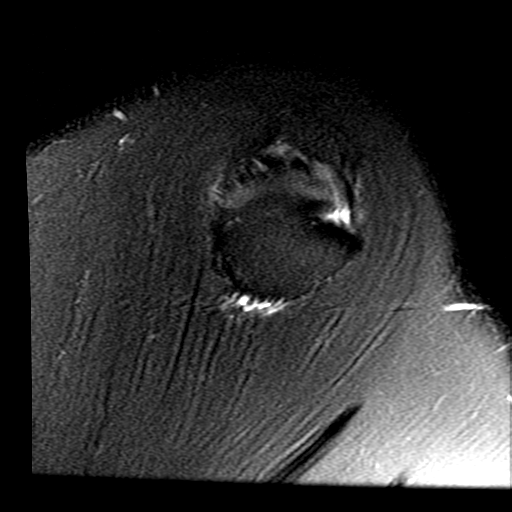

[Series 9: T2 fat-sat · sagittal · 3.0mm · 0.27mm/px · 3 of 21 slices shown (2 of 3)]
[im 3/21]
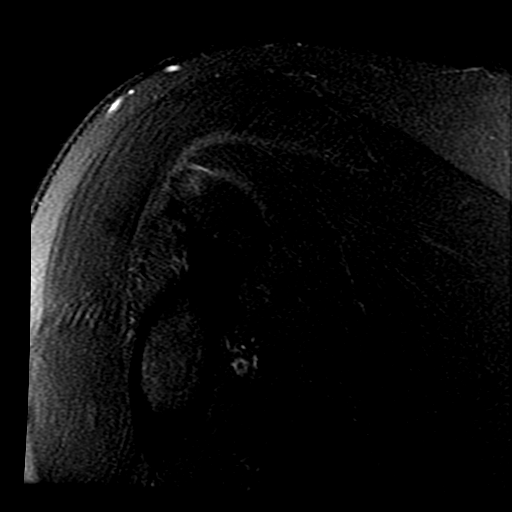
[im 12/21]
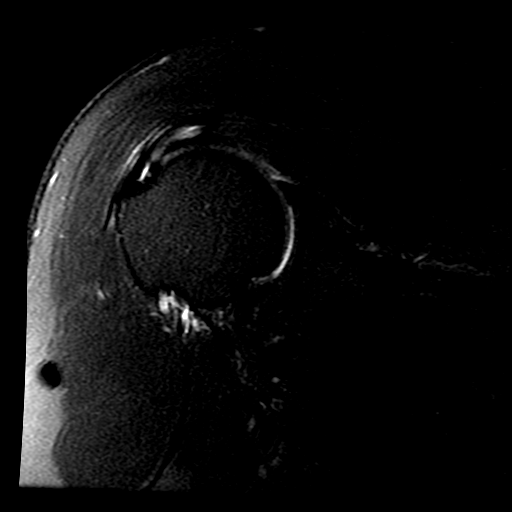
[im 18/21]
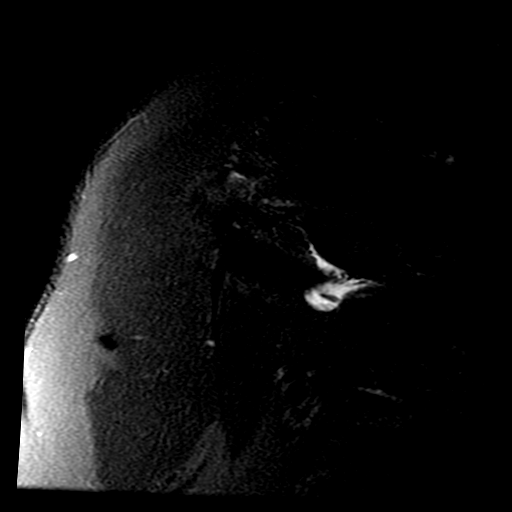

[Series 10: T2 fat-sat · axial · 3.0mm · 0.27mm/px · z∈[+42,+95]mm · 3 of 22 slices shown (3 of 3)]
[im 4/22]
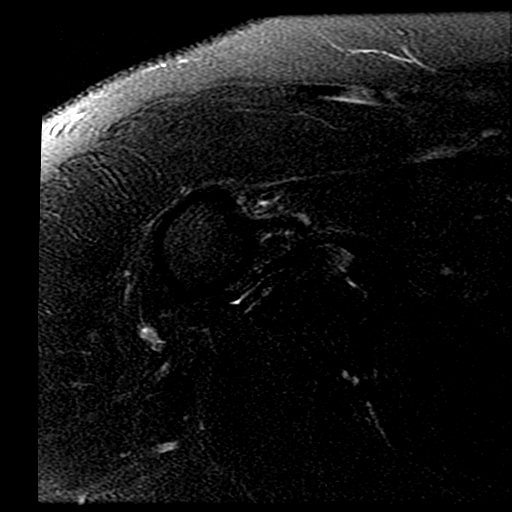
[im 13/22]
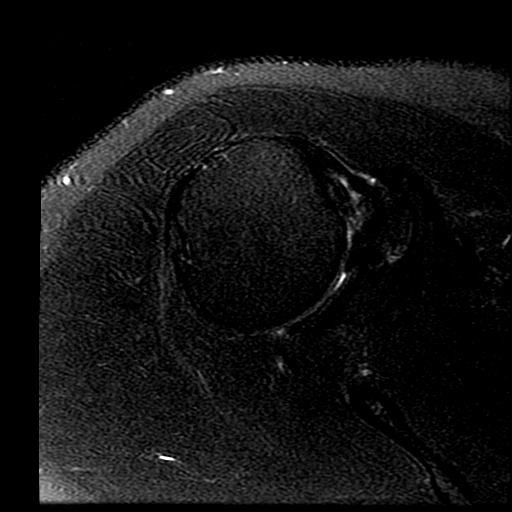
[im 19/22]
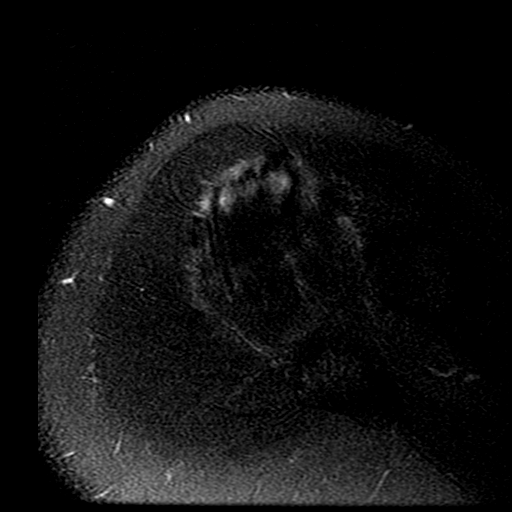

[19 of 40 positions shown; findings below may reference images not displayed]

FINDINGS: Rotator cuff: There is a fairly extensive intrasubstance tear of
distal 3 cm of the infraspinatus tendon the tear does not extend to
the articular or bursal surface. The rest of the rotator cuff is
normal.

Muscles:  No atrophy or edema.

Biceps long head:  Properly located and intact.

Acromioclavicular Joint:  Normal.  Type 1 acromion.  No bursitis.

Glenohumeral Joint: Degenerative changes of the inferior aspect of
the glenoid. Tiny marginal osteophytes on the inferior aspect of the
humeral head. No joint effusion.

Labrum: No discrete tears. Degenerative changes of the inferior
aspect of the labrum.

Bones:  The extra-articular bones are normal.
IMPRESSION: Extensive intrasubstance tears of the distal infraspinatus tendon.

Slight degenerative changes of the glenohumeral joint.
